# Patient Record
Sex: Male | Born: 1975 | Race: White | Hispanic: No | Marital: Single | Smoking: Never smoker
Health system: Southern US, Community
[De-identification: ages and names within clinical notes are randomized; demographics above are authoritative.]

## PROBLEM LIST (undated history)

## (undated) DIAGNOSIS — F32A Depression, unspecified: Secondary | ICD-10-CM

---

## 2005-07-20 ENCOUNTER — Emergency Department (HOSPITAL_COMMUNITY): Admission: EM | Admit: 2005-07-20 | Discharge: 2005-07-20 | Payer: Self-pay | Admitting: Emergency Medicine

## 2005-09-14 ENCOUNTER — Inpatient Hospital Stay (HOSPITAL_COMMUNITY): Admission: RE | Admit: 2005-09-14 | Discharge: 2005-09-15 | Payer: Self-pay | Admitting: Orthopedic Surgery

## 2007-01-11 ENCOUNTER — Emergency Department (HOSPITAL_COMMUNITY): Admission: EM | Admit: 2007-01-11 | Discharge: 2007-01-11 | Payer: Self-pay | Admitting: Emergency Medicine
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
## 2009-01-24 IMAGING — CR DG HAND COMPLETE 3+V*L*
3 series · 3 of 3 positions shown · non-contrast
Comparison: None available.

CLINICAL DATA: 30-year-old, dog bite.  
 LEFT HAND ? 3 VIEW:

[x hand pa left]
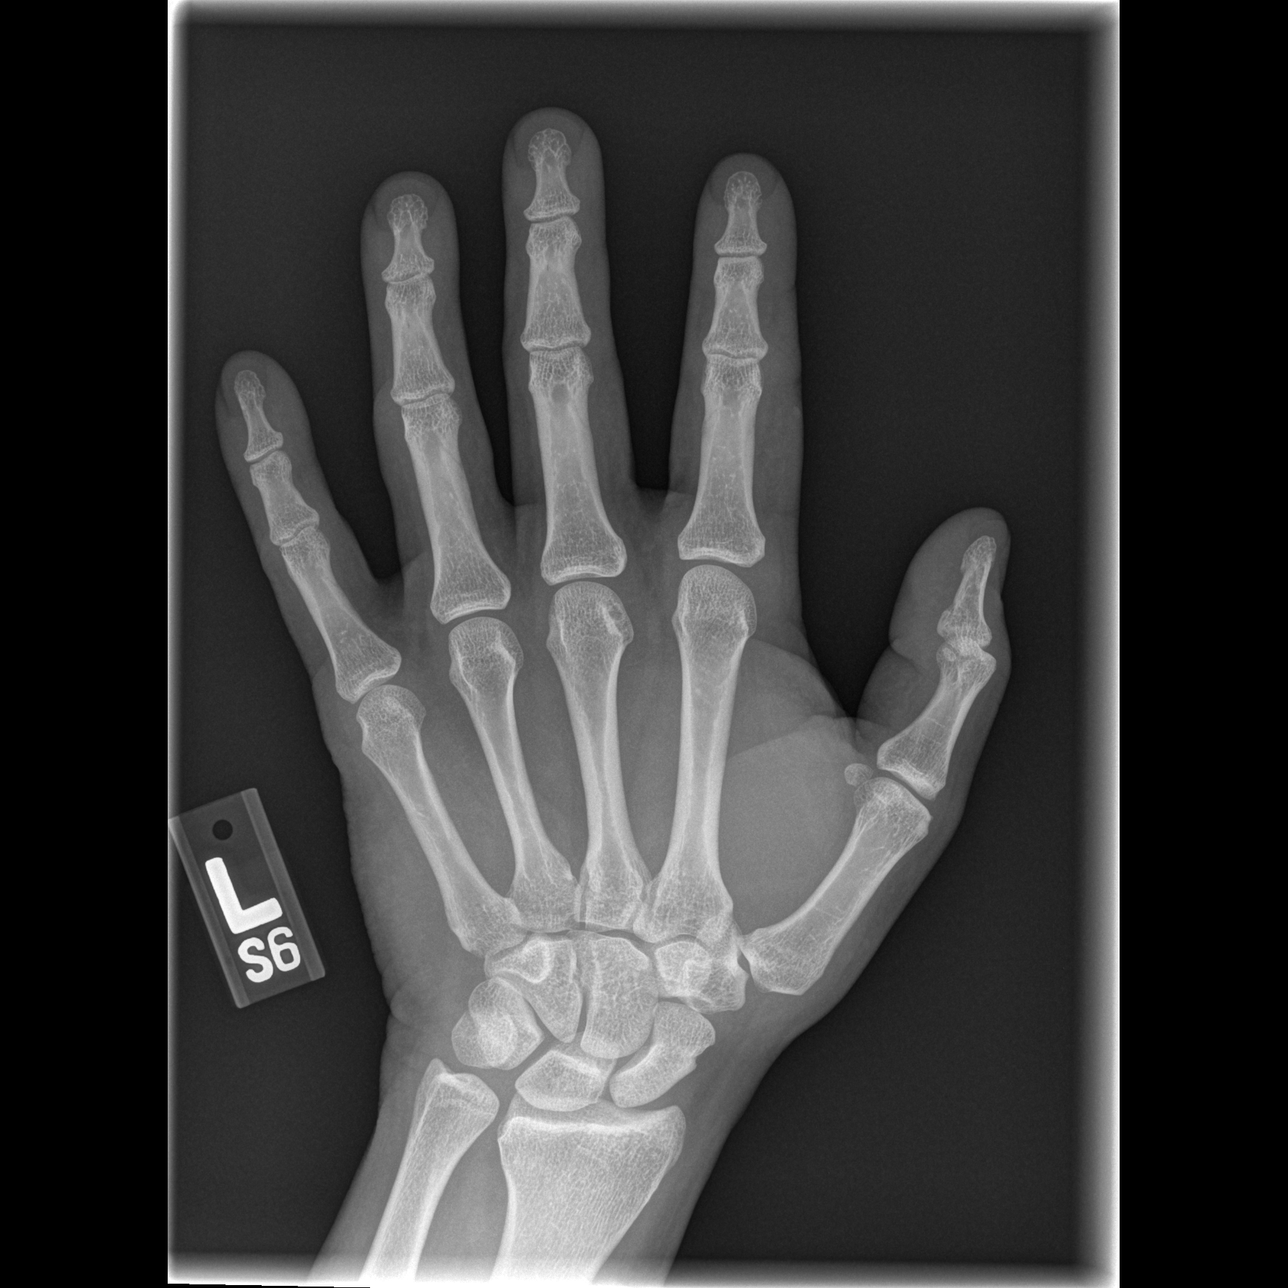

[x hand oblique left]
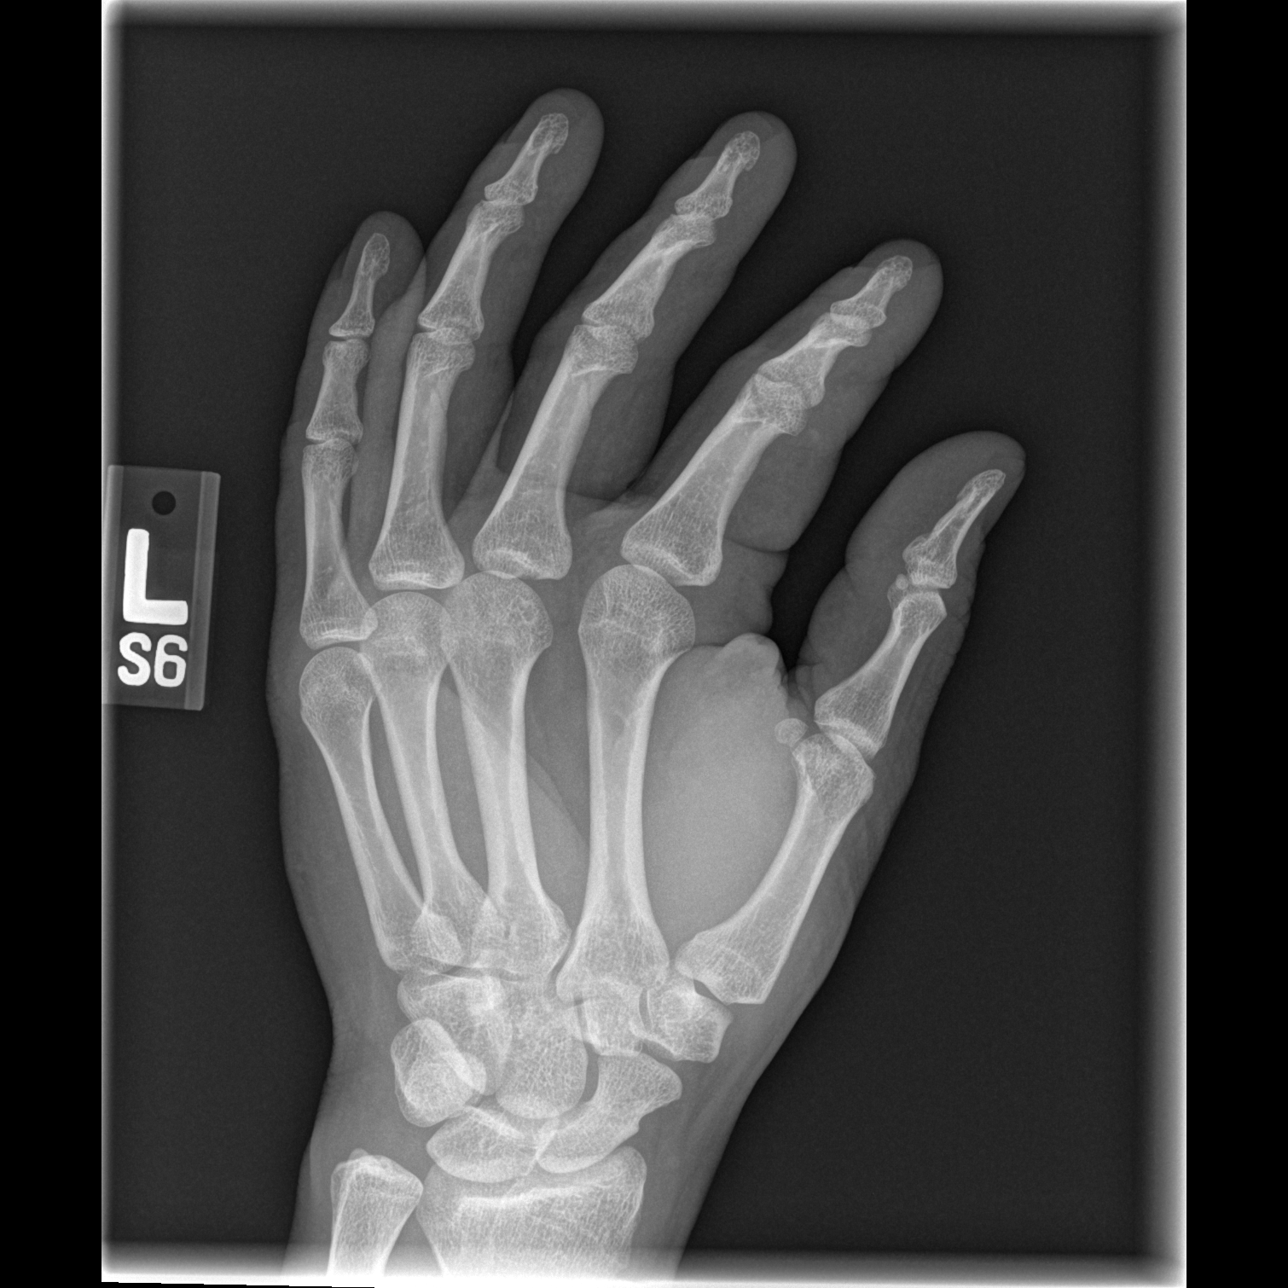

[x hand lat left]
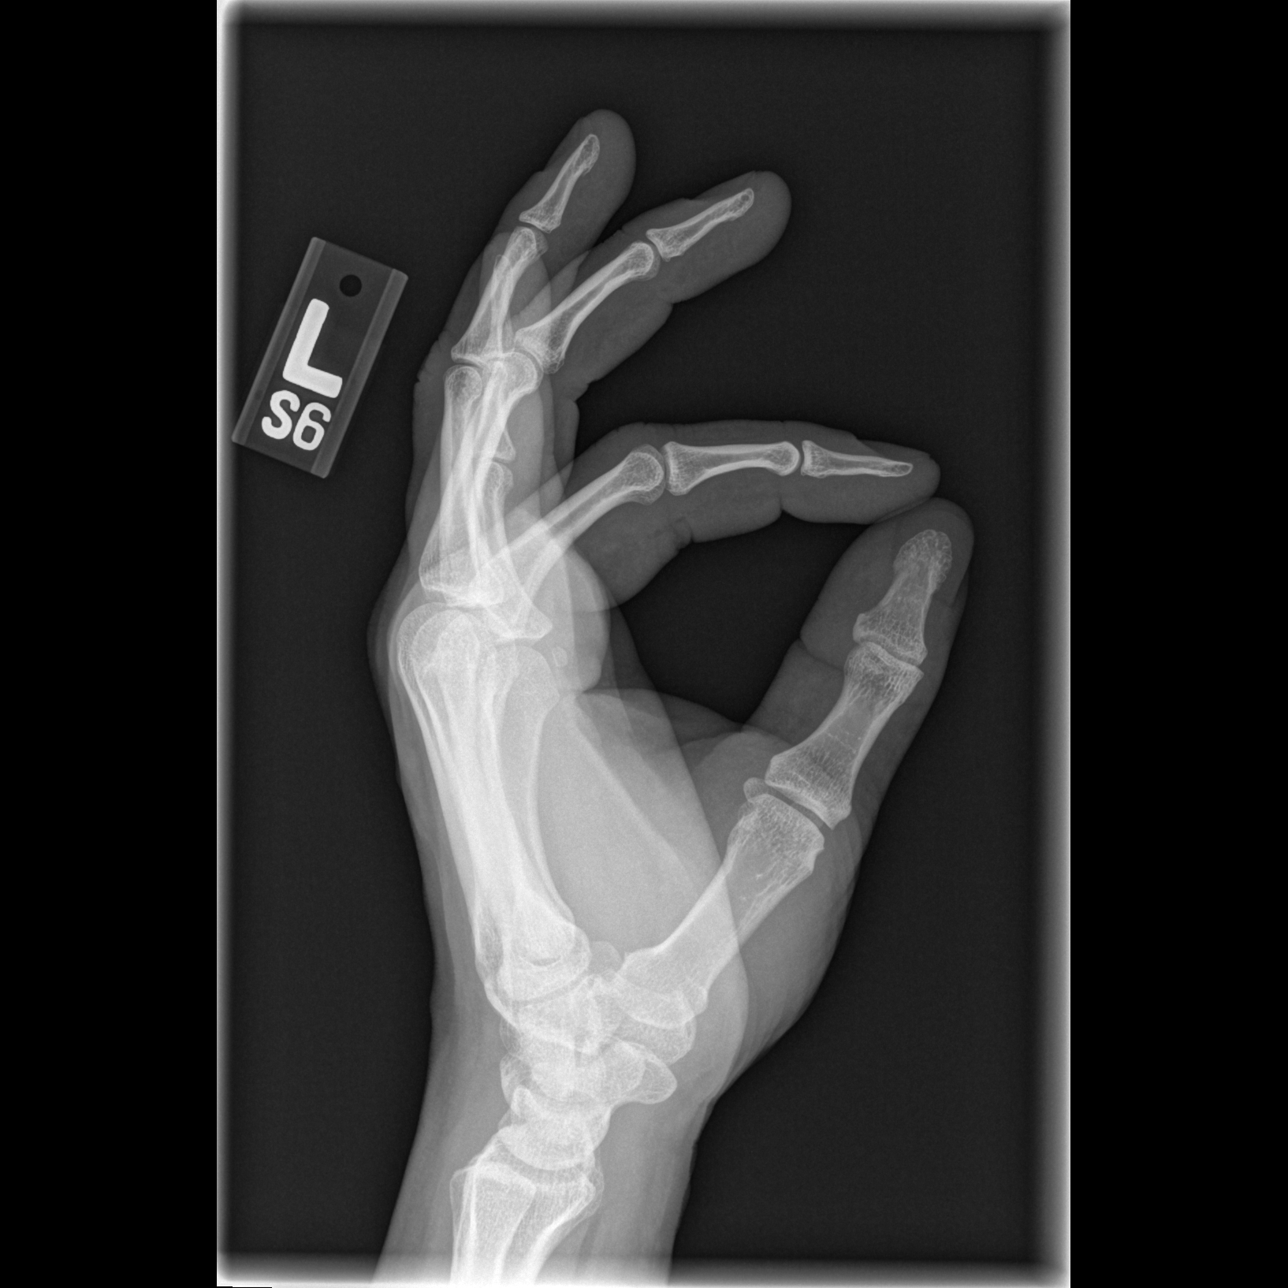

[3 of 3 positions shown; findings below may reference images not displayed]

FINDINGS: The joint spaces are maintained.  No fractures are seen.  No radiopaque foreign bodies are seen.
IMPRESSION: No acute bony findings and no radiopaque foreign body.

## 2010-12-21 NOTE — Op Note (Signed)
NAMEElane Floyd             ACCOUNT NO.:  [id number]   MEDICAL RECORD NO.:  [id number]          PATIENT TYPE:  INP   LOCATION:  5016                         FACILITY:  [facility]   PHYSICIAN:  Randy Floyd, M.D.DATE OF BIRTH:  Mar 14, 1976   DATE OF PROCEDURE:  09/14/2005  DATE OF DISCHARGE:  09/15/2005                                 OPERATIVE REPORT   PREOPERATIVE DIAGNOSIS:  Right elbow triceps tendon avulsion with bony  fracture about the olecranon tip.   POSTOPERATIVE DIAGNOSIS:  Right elbow triceps tendon avulsion with bony  fracture about the olecranon tip.   OPERATION PERFORMED:  1.  Removal of bony fragment, right elbow.  2.  Repair triceps tendon, right elbow with bony tunnel technique.  3.  Right ulnar nerve decompression at the elbow (in situ release).  4.  Stress radiography, right elbow.   SURGEON:  Randy Floyd, M.D.   ASSISTANT:  Randy Floyd, P.A.-C.   ANESTHESIA:  General.   COMPLICATIONS:  None.   ESTIMATED BLOOD LOSS:  Minimal.   INDICATIONS FOR PROCEDURE:  The patient is a very pleasant 35 year old male  who presents with the above mentioned diagnosis.  I have counseled the  patient in regard to risks and benefits of surgery including risks of  infection, bleeding, anesthesia, damage to normal structures, and failure of  surgery to accomplish its intended goals of relieving symptoms and restoring  function.  With this in mind, he desires to proceed.  All questions have  been encouraged and answered preoperatively.   OPERATIVE FINDINGS:  This patient had a triceps tendon rupture.  This  rupture occurred at the distal insertion. There was a bony fragment which  had gone on to early nonunion type characteristics.  There was no meaningful  healing.  The patient underwent fragment removal and advancement of the  triceps tendon to the olecranon with direct repair.  He also underwent ulnar  transposition and I did check under fluoroscopy his  ulnar collateral  ligament medially which appeared to be sound and intact.   DESCRIPTION OF PROCEDURE:  The patient was seen by myself and anesthesia and  taken to the operating suite, underwent smooth induction of general  anesthesia.  He was laid in a slight lateral position with bean bag well  padded and then underwent a prep and drape about the right upper extremity.  I was able to achieve full motion on the table.  He had good stability over  all with a negative posterolateral pivot shift test.   After securing the sterile field with sterile prep and drape, the patient  had a posterior medial incision.  Skin flaps elevated.  Once this was done,  the ulnar nerve was identified proximally and released about the arcade of  Struthers medial intermuscular septum, cubital tunnel, two heads of the FCU  and Osborne's ligament.  This was done to my satisfaction without  difficulty.  Following release, the patient then underwent careful and  meticulous dissection about the olecranon tip.  The bony fragment was  identified and removed.  There was a clear nonunion/no significant healing  characteristics where the fragment was positioned and avulsed from the  triceps.  I removed the bony fragment which was a fracture fragment from his  prior injury and following this, debrided the triceps tendon to healthy  appearing fibers.  I then placed a Bunnell stitch with #2 FiberWire and  allowed this to exit at the tendon insertion distally where it had  previously pulled off.  I then created drill holes which criss-crossed about  the ulna and threaded the FiberWire sutures through the drill holes securing  the triceps repair to the bone.  I then oversewed the triceps with 2-0  FiberWire back upon itself.  This was done to my satisfaction without  difficulty.  Following this, I performed range of motion which looked  excellent.  The repair was competent and I felt very good about it.  I then  performed  live fluoro, stress testing the ulnar collateral ligament etc., to  make sure that there was no gross instability. There was not any gross  instability and all looked fairly well.  I was pleased with this and the  findings.  Once this was done, I then deflated the tourniquet, closed the  subcu with Vicryl and closed the skin edges with staple gun.  He was placed  in a long-arm splint at 80 degrees of flexion and tolerated the procedure  well.  There were no complicating features.   Thus, she underwent bony fracture fragment excision, repair of the triceps  and ulnar nerve decompression. Stress radiography revealed stability about  the lateral and medial ulnar collateral ligaments.  Will monitor his  condition closely, begin rehabilitation at first postoperative visit in 10  to 14 days.  He will be admitted overnight for intravenous antibiotics,  observation, pain control, etc.  20 to 25 mL of Marcaine with epinephrine  was placed in the wound for postoperative analgesia at conclusion of the  case.  It has been a pleasure to participate in his care, will look forward  to participating in his postoperative recovery.           ______________________________  Randy Floyd, M.D.     Randy Floyd  D:  09/14/2005  T:  09/15/2005  Job:  [id number]

## 2015-02-11 ENCOUNTER — Encounter (HOSPITAL_COMMUNITY): Payer: Self-pay | Admitting: *Deleted

## 2015-02-11 ENCOUNTER — Emergency Department (HOSPITAL_COMMUNITY)
Admission: EM | Admit: 2015-02-11 | Discharge: 2015-02-11 | Disposition: A | Discharge status: Home / Self Care | Payer: 59 | Source: Home / Self Care | Attending: Family Medicine | Admitting: Family Medicine

## 2015-02-11 DIAGNOSIS — K645 Perianal venous thrombosis: Secondary | ICD-10-CM | POA: Diagnosis not present

## 2015-02-11 NOTE — Discharge Instructions (Signed)
Avoid constipation, use wet wipes and warm tub soaks for 2-3 days.  See specialist or return if further problems.

## 2015-02-11 NOTE — ED Provider Notes (Signed)
CSN: [id number]     Arrival date & time 02/11/15  1304 History   First MD Initiated Contact with Patient 02/11/15 1347     Chief Complaint  Patient presents with  . Rectal Pain   (Consider location/radiation/quality/duration/timing/severity/associated sxs/prior Treatment) Patient is a 39 y.o. male presenting with hematochezia. The history is provided by the patient.  Rectal Bleeding Quality:  Bright red Amount:  Moderate Duration:  3 days Progression:  Worsening Chronicity:  New Context: hemorrhoids and rectal pain   Pain details:    Quality:  Throbbing   Severity:  Moderate   Progression:  Worsening Similar prior episodes: yes   Relieved by:  Nothing Worsened by:  Nothing tried Ineffective treatments:  None tried Associated symptoms: light-headedness   Associated symptoms: no abdominal pain     History reviewed. No pertinent past medical history. History reviewed. No pertinent past surgical history. History reviewed. No pertinent family history. History  Substance Use Topics  . Smoking status: Not on file  . Smokeless tobacco: Not on file  . Alcohol Use: No    Review of Systems  Constitutional: Negative.   Gastrointestinal: Positive for blood in stool, hematochezia, anal bleeding and rectal pain. Negative for abdominal pain.  Skin: Negative.   Neurological: Positive for light-headedness.    Allergies  Septra  Home Medications   Prior to Admission medications   Not on File   BP 134/85 mmHg  Pulse 99  Temp(Src) 98.7 F (37.1 C) (Oral)  Resp 16  SpO2 99% Physical Exam  Constitutional: He is oriented to person, place, and time. He appears well-developed and well-nourished. He appears distressed.  Abdominal: Soft. Bowel sounds are normal. There is no tenderness.  Genitourinary:     Neurological: He is alert and oriented to person, place, and time.  Skin: Skin is warm and dry.  Nursing note and vitals reviewed.   ED Course  INCISION AND  DRAINAGE Date/Time: 02/11/2015 2:17 PM Performed by: Linna HoffKINDL, Seleny Allbright D Authorized by: Bradd CanaryKINDL, Temica Righetti D Consent: Verbal consent obtained. Consent given by: patient Indications for incision and drainage: thrombosed hem. Local anesthetic: topical anesthetic Patient sedated: no Scalpel size: 11 Incision type: single straight Complexity: simple Drainage characteristics: mult lg clots expressed, sx relieved.   (including critical care time) Labs Review Labs Reviewed - No data to display  Imaging Review No results found.   MDM   1. Thrombosed external hemorrhoid        Linna HoffJames D Rumaisa Schnetzer, MD 02/11/15 1421

## 2015-02-11 NOTE — ED Notes (Signed)
Pt reports  Symptoms  Of     Rectal  Pain  With  Some  External bleeding     With  Symptoms  For several  Days      pt  Reports  Has  Had  Similar  Episode in past   -   Pt  Reports   As  Well that  The  Pain is  Worse  When he  Has  A  bm

## 2015-02-12 ENCOUNTER — Encounter (HOSPITAL_COMMUNITY): Payer: Self-pay | Admitting: Emergency Medicine

## 2015-02-12 ENCOUNTER — Emergency Department (INDEPENDENT_AMBULATORY_CARE_PROVIDER_SITE_OTHER)
Admission: EM | Admit: 2015-02-12 | Discharge: 2015-02-12 | Disposition: A | Discharge status: Home / Self Care | Payer: 59 | Source: Home / Self Care | Attending: Emergency Medicine | Admitting: Emergency Medicine

## 2015-02-12 DIAGNOSIS — K645 Perianal venous thrombosis: Secondary | ICD-10-CM | POA: Diagnosis not present

## 2015-02-12 MED ORDER — HYDROCORTISONE 2.5 % RE CREA: TOPICAL_CREAM | RECTAL | Status: AC

## 2015-02-12 NOTE — Discharge Instructions (Signed)
Your hemorrhoid had rethrombosed. We got rid of the clot again today. Do warm compresses or sitz baths 3 times a day. Apply the hydrocortisone cream twice a day. Use MiraLAX and stool softeners to make sure you have soft bowel movements. If this is not improving in the next 2-3 days, [facility].

## 2015-02-12 NOTE — ED Provider Notes (Signed)
CSN: [id number]     Arrival date & time 02/12/15  1512 History   First MD Initiated Contact with Patient 02/12/15 1610     Chief Complaint  Patient presents with  . Rectal Pain   (Consider location/radiation/quality/duration/timing/severity/associated sxs/prior Treatment) HPI He is a 39 year old man here for evaluation of rectal pain. He was seen here yesterday and treated with an I&D for a thrombosed external hemorrhoid. He states his pain was temporarily relieved, but then he came back last night. He states this morning it felt like the hemorrhoid was back. His last bowel movement was 2 days ago. He has MiraLAX and stool softeners at home, but has not taken them yet.  History reviewed. No pertinent past medical history. History reviewed. No pertinent past surgical history. No family history on file. History  Substance Use Topics  . Smoking status: Never Smoker   . Smokeless tobacco: Never Used  . Alcohol Use: No    Review of Systems As in history of present illness Allergies  Septra  Home Medications   Prior to Admission medications   Medication Sig Start Date End Date Taking? Authorizing Provider  hydrocortisone (ANUSOL-HC) 2.5 % rectal cream Apply rectally 2 times daily 02/12/15   Charm RingsErin J Renald Haithcock, MD   BP 123/73 mmHg  Pulse 67  Temp(Src) 99.1 F (37.3 C) (Oral)  SpO2 97% Physical Exam  Constitutional: He is oriented to person, place, and time. He appears well-developed and well-nourished.  Cardiovascular: Normal rate.   Pulmonary/Chest: Effort normal.  Genitourinary:     Neurological: He is alert and oriented to person, place, and time.    ED Course  INCISION AND DRAINAGE Date/Time: 02/12/2015 4:47 PM Performed by: Charm RingsHONIG, Yuette Putnam J Authorized by: Charm RingsHONIG, Glennis Borger J Consent: Verbal consent obtained. Risks and benefits: risks, benefits and alternatives were discussed Consent given by: patient Patient understanding: patient states understanding of the procedure being  performed Patient identity confirmed: verbally with patient Time out: Immediately prior to procedure a "time out" was called to verify the correct patient, procedure, equipment, support staff and site/side marked as required. Indications for incision and drainage: Thrombosed hemorrhoid. Anesthesia: local infiltration Local anesthetic: lidocaine 2% without epinephrine Anesthetic total: 1 ml Scalpel size: 11 Incision type: single straight Drainage characteristics: Blood clot. Wound treatment: wound left open Patient tolerance: Patient tolerated the procedure well with no immediate complications   (including critical care time) Labs Review Labs Reviewed - No data to display  Imaging Review No results found.   MDM   1. Thrombosed hemorrhoids    I&D performed today. Recommended warm compresses or sitz baths 3 times a day. Anusol twice a day. Encouraged use of MiraLAX and stool softeners to prevent constipation. If his symptoms have not improved over the next few days, [facility].    Charm RingsErin J Tishana Clinkenbeard, MD 02/12/15 [phone]

## 2015-02-12 NOTE — ED Notes (Signed)
Pt returns today with c/o rectal pain and bleeding s/p Thrombosed external hemorrhoids with I/D Pt seen yesterday at [facility]- no medication prescribed or tucks C/p constant, sharp achy pain

## 2015-04-03 ENCOUNTER — Encounter: Payer: Self-pay | Admitting: General Surgery

## 2015-04-03 NOTE — Progress Notes (Signed)
Amount of fiber in different foods  Food Serving Grams of fiber  Fruits  Apple (with skin) 1 medium apple 4.4  Banana 1 medium banana 3.1  Oranges 1 orange 3.1  Prunes 1 cup, pitted 12.4  Juices  Apple, unsweetened, w/added ascorbic acid 1 cup 0.5  Grapefruit, white, canned, sweetened 1 cup 0.2  Grape, unsweetened, w/added ascorbic acid 1 cup 0.5  Orange 1 cup 0.7  Vegetables  Cooked  Green beans 1 cup 4.0  Carrots 1/2 cup sliced 2.3  Peas 1 cup 8.8  Potato (baked, with skin) 1 medium potato 3.8  Raw  Cucumber (with peel) 1 cucumber 1.5  Lettuce 1 cup shredded 0.5  Tomato 1 medium tomato 1.5  Spinach 1 cup 0.7  Legumes  Baked beans, canned, no salt added 1 cup 13.9  Kidney beans, canned 1 cup 13.6  Lima beans, canned 1 cup 11.6  Lentils, boiled 1 cup 15.6  Breads, pastas, flours  Bran muffins 1 medium muffin 5.2  Oatmeal, cooked 1 cup 4.0  White bread 1 slice 0.6  Whole-wheat bread 1 slice 1.9  Pasta and rice, cooked  Macaroni 1 cup 2.5  Rice, brown 1 cup 3.5  Rice, white 1 cup 0.6  Spaghetti (regular) 1 cup 2.5  Nuts  Almonds 1/2 cup 8.7  Peanuts 1/2 cup 7.9  To learn how much fiber and other nutrients are in different foods, visit the [place] of [employer] manager) [employer] Database at: https://www.rogers.biz/.  Created using data from the [employer] Database for Standard Reference. Available at https://www.rogers.biz/.  Graphic 16109 Version 3.0  Diet given to him.  Avel Peace, MD

## 2023-08-01 ENCOUNTER — Other Ambulatory Visit: Payer: Self-pay

## 2023-08-01 ENCOUNTER — Ambulatory Visit
Admission: EM | Admit: 2023-08-01 | Discharge: 2023-08-01 | Disposition: A | Discharge status: Home / Self Care | Payer: 59

## 2023-08-01 ENCOUNTER — Ambulatory Visit (INDEPENDENT_AMBULATORY_CARE_PROVIDER_SITE_OTHER): Payer: 59

## 2023-08-01 ENCOUNTER — Encounter: Payer: Self-pay | Admitting: Emergency Medicine

## 2023-08-01 ENCOUNTER — Telehealth: Payer: Self-pay | Admitting: Family Medicine

## 2023-08-01 DIAGNOSIS — R051 Acute cough: Secondary | ICD-10-CM

## 2023-08-01 DIAGNOSIS — R079 Chest pain, unspecified: Secondary | ICD-10-CM

## 2023-08-01 HISTORY — DX: Depression, unspecified: F32.A

## 2023-08-01 LAB — POCT INFLUENZA A/B
Influenza A, POC: NEGATIVE
Influenza B, POC: NEGATIVE

## 2023-08-01 MED ORDER — PROMETHAZINE-DM 6.25-15 MG/5ML PO SYRP: 5.0000 mL | ORAL_SOLUTION | Freq: Four times a day (QID) | ORAL | 0 refills | Status: AC | PRN

## 2023-08-01 MED ORDER — PREDNISONE 20 MG PO TABS: 40.0000 mg | ORAL_TABLET | Freq: Every day | ORAL | 0 refills | Status: AC

## 2023-08-01 NOTE — ED Provider Notes (Signed)
[facility]    CSN: [id number] Arrival date & time: 08/01/23  1251      History   Chief Complaint No chief complaint on file.   HPI Randy Floyd is a 47 y.o. male.   Patient presenting today with several day history of congestion, productive cough, chills, weakness, pleuritic chest pain.  Denies fever, severe shortness of breath, abdominal pain, nausea vomiting or diarrhea.  So far trying over-the-counter remedies with minimal relief.  No known history of chronic pulmonary disease.  Has had a history of pneumonia and states this feels similar.    Past Medical History:  Diagnosis Date   Depression     There are no active problems to display for this patient.   History reviewed. No pertinent surgical history.     Home Medications    Prior to Admission medications   Medication Sig Start Date End Date Taking? Authorizing Provider  mirtazapine (REMERON) 7.5 MG tablet Take 7.5 mg by mouth at bedtime.   Yes [provider]  Vilazodone HCl (VIIBRYD) 40 MG TABS Take 40 mg by mouth daily.   Yes [provider]  hydrocortisone (ANUSOL-HC) 2.5 % rectal cream Apply rectally 2 times daily 02/12/15   Charm Rings, MD  predniSONE (DELTASONE) 20 MG tablet Take 2 tablets (40 mg total) by mouth daily with breakfast. 08/01/23   Particia Nearing, PA-C  promethazine-dextromethorphan (PROMETHAZINE-DM) 6.25-15 MG/5ML syrup Take 5 mLs by mouth 4 (four) times daily as needed. 08/01/23   Particia Nearing, PA-C    Family History History reviewed. No pertinent family history.  Social History Social History   Tobacco Use   Smoking status: Never   Smokeless tobacco: Never  Substance Use Topics   Alcohol use: No   Drug use: No     Allergies   Septra [sulfamethoxazole-trimethoprim]   Review of Systems Review of Systems Per HPI  Physical Exam Triage Vital Signs ED Triage Vitals  Encounter Vitals Group     BP 08/01/23 1409 135/83      Systolic BP Percentile --      Diastolic BP Percentile --      Pulse Rate 08/01/23 1409 99     Resp 08/01/23 1409 18     Temp 08/01/23 1409 99.2 F (37.3 C)     Temp Source 08/01/23 1409 Oral     SpO2 08/01/23 1409 96 %     Weight --      Height --      Head Circumference --      Peak Flow --      Pain Score 08/01/23 1410 5     Pain Loc --      Pain Education --      Exclude from Growth Chart --    No data found.  Updated Vital Signs BP 135/83 (BP Location: Right Arm)   Pulse 99   Temp 99.2 F (37.3 C) (Oral)   Resp 18   SpO2 96%   Visual Acuity Right Eye Distance:   Left Eye Distance:   Bilateral Distance:    Right Eye Near:   Left Eye Near:    Bilateral Near:     Physical Exam Vitals and nursing note reviewed.  Constitutional:      Appearance: He is well-developed.  HENT:     Head: Atraumatic.     Right Ear: External ear normal.     Left Ear: External ear normal.     Nose: Rhinorrhea present.  Mouth/Throat:     Pharynx: No oropharyngeal exudate or posterior oropharyngeal erythema.  Eyes:     Conjunctiva/sclera: Conjunctivae normal.     Pupils: Pupils are equal, round, and reactive to light.  Cardiovascular:     Rate and Rhythm: Normal rate and regular rhythm.  Pulmonary:     Effort: Pulmonary effort is normal. No respiratory distress.     Breath sounds: No wheezing or rales.  Musculoskeletal:        General: Normal range of motion.     Cervical back: Normal range of motion and neck supple.  Lymphadenopathy:     Cervical: No cervical adenopathy.  Skin:    General: Skin is warm and dry.  Neurological:     Mental Status: He is alert and oriented to person, place, and time.  Psychiatric:        Behavior: Behavior normal.      UC Treatments / Results  Labs (all labs ordered are listed, but only abnormal results are displayed) Labs Reviewed  POCT INFLUENZA A/B    EKG   Radiology DG Chest 2 View Result Date: 08/01/2023 CLINICAL  DATA:  Productive cough and chest pain for 2 days. EXAM: CHEST - 2 VIEW COMPARISON:  None Available. FINDINGS: The heart size and mediastinal contours are within normal limits. Both lungs are clear. The visualized skeletal structures are unremarkable. IMPRESSION: No active cardiopulmonary disease. Electronically Signed   By: Danae Orleans M.D.   On: 08/01/2023 16:08    Procedures Procedures (including critical care time)  Medications Ordered in UC Medications - No data to display  Initial Impression / Assessment and Plan / UC Course  I have reviewed the triage vital signs and the nursing notes.  Pertinent labs & imaging results that were available during my care of the patient were reviewed by me and considered in my medical decision making (see chart for details).     Vital signs reassuring, he appears in no acute distress.  EKG today showing normal sinus rhythm at 92 bpm with occasional PVCs.  No acute ST or T wave changes.  Chest x-ray today negative for pneumonia or other acute abnormalities.  COVID flu negative.  Will treat for viral bronchitis with prednisone, Phenergan DM, supportive over-the-counter medications and home care.  ED precautions given for severely worsening symptoms.  Final Clinical Impressions(s) / UC Diagnoses   Final diagnoses:  Acute cough  Chest pain, unspecified type     Discharge Instructions      So far your workup is reassuring.  We will call once your chest x-ray is resulted and discuss medications and a plan from there.  Go to the emergency department for severely worsening symptoms at any time.    ED Prescriptions   None    PDMP not reviewed this encounter.   Particia Nearing, [place] 08/01/23 1943

## 2023-08-01 NOTE — Telephone Encounter (Signed)
Prednisone and Phenergan DM sent to treat viral bronchitis.  Patient notified

## 2023-08-01 NOTE — ED Triage Notes (Signed)
States having pain in chest when coughing.  Having chills and feeling weak.  Hurts when breathing as well

## 2023-08-01 NOTE — Discharge Instructions (Signed)
So far your workup is reassuring.  We will call once your chest x-ray is resulted and discuss medications and a plan from there.  Go to the emergency department for severely worsening symptoms at any time.
